# Patient Record
Sex: Male | Born: 1955 | Race: White | Hispanic: No | Marital: Single | Smoking: Current every day smoker
Health system: Southern US, Community
[De-identification: ages and names within clinical notes are randomized; demographics above are authoritative.]

## PROBLEM LIST (undated history)

## (undated) DIAGNOSIS — G20A1 Parkinson's disease without dyskinesia, without mention of fluctuations: Secondary | ICD-10-CM

## (undated) DIAGNOSIS — G20 Parkinson's disease: Secondary | ICD-10-CM

## (undated) HISTORY — PX: ABDOMINAL SURGERY: SHX537

---

## 1999-04-29 ENCOUNTER — Ambulatory Visit (HOSPITAL_COMMUNITY): Admission: RE | Admit: 1999-04-29 | Discharge: 1999-04-29 | Payer: Self-pay | Admitting: Gastroenterology

## 1999-07-04 ENCOUNTER — Encounter: Admission: RE | Admit: 1999-07-04 | Discharge: 1999-07-04 | Payer: Self-pay | Admitting: Internal Medicine

## 2008-10-19 ENCOUNTER — Emergency Department (HOSPITAL_COMMUNITY): Admission: EM | Admit: 2008-10-19 | Discharge: 2008-10-19 | Payer: Self-pay | Admitting: Emergency Medicine

## 2008-10-19 ENCOUNTER — Inpatient Hospital Stay (HOSPITAL_COMMUNITY): Admission: EM | Admit: 2008-10-19 | Discharge: 2008-10-24 | Payer: Self-pay | Admitting: Emergency Medicine

## 2008-11-03 ENCOUNTER — Encounter: Payer: Self-pay | Admitting: Internal Medicine

## 2011-01-03 LAB — LIPASE, BLOOD: Lipase: 67 U/L — ABNORMAL HIGH (ref 11–59)

## 2011-01-03 LAB — CBC
HCT: 35.1 % — ABNORMAL LOW (ref 39.0–52.0)
HCT: 44.6 % (ref 39.0–52.0)
Hemoglobin: 12.4 g/dL — ABNORMAL LOW (ref 13.0–17.0)
Hemoglobin: 13.1 g/dL (ref 13.0–17.0)
MCHC: 35.4 g/dL (ref 30.0–36.0)
MCV: 94.8 fL (ref 78.0–100.0)
Platelets: 188 10*3/uL (ref 150–400)
RBC: 3.7 MIL/uL — ABNORMAL LOW (ref 4.22–5.81)
RBC: 4 MIL/uL — ABNORMAL LOW (ref 4.22–5.81)
RDW: 12.9 % (ref 11.5–15.5)
RDW: 13.1 % (ref 11.5–15.5)
WBC: 11.6 10*3/uL — ABNORMAL HIGH (ref 4.0–10.5)
WBC: 11.7 10*3/uL — ABNORMAL HIGH (ref 4.0–10.5)

## 2011-01-03 LAB — DIFFERENTIAL
Basophils Absolute: 0 10*3/uL (ref 0.0–0.1)
Eosinophils Absolute: 0 10*3/uL (ref 0.0–0.7)
Eosinophils Relative: 0 % (ref 0–5)
Lymphocytes Relative: 10 % — ABNORMAL LOW (ref 12–46)
Lymphs Abs: 0.9 10*3/uL (ref 0.7–4.0)
Monocytes Absolute: 1 10*3/uL (ref 0.1–1.0)
Monocytes Relative: 9 % (ref 3–12)
Neutro Abs: 9.4 10*3/uL — ABNORMAL HIGH (ref 1.7–7.7)

## 2011-01-03 LAB — POCT CARDIAC MARKERS
CKMB, poc: 2.8 ng/mL (ref 1.0–8.0)
Troponin i, poc: <0.05 ng/mL (ref 0.00–0.09)

## 2011-01-03 LAB — BASIC METABOLIC PANEL
Calcium: 8 mg/dL — ABNORMAL LOW (ref 8.4–10.5)
GFR calc Af Amer: >60 mL/min (ref >60)
GFR calc non Af Amer: >60 mL/min (ref >60)
Sodium: 136 mEq/L (ref 135–145)

## 2011-01-03 LAB — POCT I-STAT, CHEM 8
BUN: 11 mg/dL (ref 6–23)
Chloride: 102 mEq/L (ref 96–112)
Creatinine, Ser: 0.9 mg/dL (ref 0.4–1.5)
Glucose, Bld: 93 mg/dL (ref 70–99)
Hemoglobin: 16 g/dL (ref 13.0–17.0)
Potassium: 4 mEq/L (ref 3.5–5.1)

## 2011-01-03 LAB — URINALYSIS, ROUTINE W REFLEX MICROSCOPIC
Bilirubin Urine: NEGATIVE
Hgb urine dipstick: NEGATIVE
Specific Gravity, Urine: 1.02 (ref 1.005–1.030)
Urobilinogen, UA: 0.2 mg/dL (ref 0.0–1.0)

## 2011-01-03 LAB — PROTIME-INR: Prothrombin Time: 13.1 seconds (ref 11.6–15.2)

## 2011-01-31 NOTE — Op Note (Signed)
[name], [name]             ACCOUNT NO.:  [id number]   MEDICAL RECORD NO.:  [id number]          PATIENT TYPE:  INP   LOCATION:  5128                         FACILITY:  [facility]   PHYSICIAN:  Sandria Bales. Ezzard Standing, M.D.  DATE OF BIRTH:  03/19/1956   DATE OF PROCEDURE:  10/19/2008  DATE OF DISCHARGE:                               OPERATIVE REPORT   Date of surgery ??   PREOPERATIVE DIAGNOSIS:  Pneumoperitoneum.   POSTOPERATIVE DIAGNOSIS:  Perforated pyloric channel ulcer.   PROCEDURE:  Oversew ulcer with Philip Olson omental patch.   SURGEON:  Sandria Bales. Ezzard Standing, MD   FIRST ASSISTANT:  Adolph Pollack, MD   ANESTHESIA:  General endotracheal.   ESTIMATED BLOOD LOSS:  50 mL.   DRAINS:  None.   INDICATIONS FOR PROCEDURE:  Philip Olson is a 55 year old white male who  has remotely seen Dr. Lona Olson as a medical doctor, is seen more  closely by Dr. Jerold Olson of the Neurology Department at [facility] for  Parkinson disease.  He presents with acute onset of abdominal pain with  a CT scan showing pneumoperitoneum.  He now comes for a laparotomy.   The indications and potential complications of surgery were explained to  the patient.  The patient was most likely felt to have a perforated  ulcer.  Risks include in bleeding, infection which I think he already  has, the need for bowel resection or ostomy, and nerve injury.   OPERATIVE NOTE:  The patient was placed in a supine position.  His  abdomen was shaved.  He had a Foley catheter in place and NG tube in  place.  He was already given cefoxitin as antibiotic.  His abdomen was  shaved, prepped with CHG and then sterilely draped.   A time-out was held identifying the patient and procedure.   An upper midline incision was made with sharp dissection and carried  down to the abdominal cavity.  Abdominal exploration revealed right and  left lobes of the liver unremarkable.  Anterior wall of stomach was  normal down to the prepyloric area  where there was purulence and  contamination noted and evidence of a perforated ulcer.  On examining,  the patient had about a 4-5 mm hole in the anterior wall of the  prepyloric area.  There was no evidence of any tumor or other mass.  I  put three 2-0 silk sutures to close the hole, then I brought some  omentum and laid over this hole and tacked this down with 2-0 silk  sutures.   I then irrigated the abdomen with 5 L of saline.  He really had a modest  contamination.  The abdomen was then closed with 2 running double-  stranded #1 PDS sutures tied in the middle.  The sponge and needle count  were correct at the end of the case.  The patient was transferred to the  recovery room in good condition.      Sandria Bales. Ezzard Standing, M.D.  Electronically Signed     DHN/MEDQ  D:  10/19/2008  T:  10/20/2008  Job:  [id number]  cc:   Titus Dubin. Alwyn Ren, MD,FACP,FCCP  Philip Olson, M.D.

## 2011-01-31 NOTE — H&P (Signed)
Philip Olson, Philip Olson             ACCOUNT NO.:  [id number]   MEDICAL RECORD NO.:  [id number]          PATIENT TYPE:  INP   LOCATION:  5128                         FACILITY:  [facility]   PHYSICIAN:  Sandria Bales. Ezzard Standing, M.D.  DATE OF BIRTH:  Jan 20, 1956   DATE OF ADMISSION:  10/19/2008  DATE OF DISCHARGE:                              HISTORY & PHYSICAL   Date of admission ?   REASON FOR ADMISSION:  Abdominal pain, pnuemoperitoneum.   HISTORY OF PRESENT ILLNESS:  Mr. Philip Olson is a 55 year old white male  who has no primary MD in [place], but sees Dr. Jerold Coombe in the  Neurology Department at [facility] for Parkinson disease of about 10 years'  duration.  He has remotely (> 10 years) seen Dr. Nelva Nay. Hopper.   He was having some vague back pain last week that he attributed to work  and lifting.  He took Aleve for about 3 or 4 days, but because of some  stomach upset, switched to ibuprofen over the weekend.   This morning about 9 a.m., he had acute onset of abdominal pain while at  work.  The pain radiated to his right shoulder.  He was taken by  ambulance to [facility], but according to the patient and his  significant other, Cleda Mccreedy, he had no attention for about 20-30 minutes so  he got up, left [name] and came to [facility].   He denies any history of known peptic ulcer disease, liver disease or  colon disease.  He has never had an upper endoscopy or colonoscopy.  He  had an elective laparoscopic cholecystectomy over 10 years ago about  2000 by Dr. Algis Downs. Newman.  He has had no colon disease or colonoscopy and  no prior abdominal surgery other than laparoscopic cholecystectomy.   He has no allergies.   His current medications include Requip and amantadine.  He takes both  these for his Parkinson disease.   REVIEW OF SYSTEMS:  NEUROLOGIC:  Again, he has a diagnosis of Parkinson  for some 10+ years and sees rarely people at [facility].  He has seen Dr.  Marga Melnick in the  past, but he thinks it has been over 5 years, if  not longer, since he has last seen him.  He does not go to his physician  regularly.  PULMONARY:  He smokes a pipe.  He takes about 2 bowls a day on his pipe.  CARDIAC:  About 10+ years, he did have some vague chest pain, where he  went to a treadmill and Cardiolite, which was apparently a negative  evaluation.  He has not seen a cardiologist in those intervening 10  years nor has he had anymore has chest pain, angina, or cardiac  complaints.  GASTROINTESTINAL:  See history of present illness.  UROLOGIC:  No kidney stones or kidney infections.   He works at [employer].   PHYSICAL EXAMINATION:  VITAL SIGNS:  Temperature 100.6, blood pressure  119/62, pulse is 92, respirations 68, sating at 96%.  GENERAL:  He is a well-nourished, average build, middle-aged male, alert  and  cooperative on physical exam.  HEENT:  Unremarkable.  NECK:  Supple, found no mass or thyromegaly.  LYMPH NODES:  He has no supraclavicular or axillary adenopathy.  LUNGS:  Clear to auscultation though he does have trouble taking a deep  breath.  HEART:  Regular rate and rhythm.  I hear no murmur or rub.  ABDOMEN:  He actually has active bowel sounds, but has epigastric  tenderness and guarding which is sort of central and not lateralized.  He has no abdominal mass.  No hernia.  EXTREMITIES:  He had good strength in the upper and lower extremities.  NEUROLOGIC:  Grossly intact.   His labs show a white blood count of 11,600, hemoglobin 15, hematocrit  44, platelet count of 188,000, he has 87% neutrophils.   His sodium is 139, potassium 4.0, chloride 102, BUN of 11, creatinine of  0.9.  His prothrombin time, PT is 13.1, INR is 1.0.  His urinalysis was  negative.  His lipase was 67.   His CT scan which I reviewed with Radiology shows absent gallbladder,  but this is what looks like tiny dots of free air/pneumoperitoneum under  the edge of his liver and around his  duodenum with some free fluid over  the right lobe of the liver.   IMPRESSION:  1. Pneumoperitoneum with signs and symptoms consistent with a probable      perforated stomach/duodenum possibly due to ulcer disease.  I spoke      to the patient and his girlfriend about the options for treatment.      I really think it needs to be explored to evaluate where the      perforation is and try to repair this.  He understands that there      is a significant risk for smoking a pipe and the need to quit this      going forward, and he may need to see somebody from a GI or medical      standpoint locally.  I discussed with him the risks of surgery,      which include, but are not limited to, bleeding, infection, and      possibility of bowel resection and possibility that the perforation      may be something other than stomach/duodenum, and the possibility      that if he has a perforation, it could be a continued problem to      manage.  2. History of Parkinson disease, which is stable for some 10 years.  3. He smokes pipe.  He knows it is bad for his health.      Sandria Bales. Ezzard Standing, M.D.  Electronically Signed     DHN/MEDQ  D:  10/19/2008  T:  10/20/2008  Job:  [id number]

## 2011-02-03 NOTE — Discharge Summary (Signed)
Philip Olson, Philip Olson             ACCOUNT NO.:  [id number]   MEDICAL RECORD NO.:  [id number]          PATIENT TYPE:  INP   LOCATION:  5128                         FACILITY:  [facility]   PHYSICIAN:  Thornton Park. Daphine Deutscher, MD  DATE OF BIRTH:  01-Jan-1956   DATE OF ADMISSION:  10/19/2008  DATE OF DISCHARGE:  10/24/2008                               DISCHARGE SUMMARY   ADMITTING PHYSICIAN:  Sandria Bales. Ezzard Standing, MD   DISCHARGING PHYSICIAN:  Thornton Park. Daphine Deutscher, MD   OPERATING PHYSICIAN:  Sandria Bales. Ezzard Standing, MD   CHIEF COMPLAINT/REASON FOR ADMISSION:  Mr. Huntsman is a 55 year old  male patient in no apparent regular medical problems with waking on the  morning of admission with severe abdominal pain.  He presented to the  [facility] ER, but had to wait an extended period.  The patient left  [name] and presented to [facility] ER.  On presentation, his  temperature was 100.6, blood pressure 119/62, and pulse 92.  On  abdominal exam, he had epigastric tenderness and guarding centrally.  No  palpable masses.  CT was done and that showed tiny dots of free air and  pneumoperitoneum under the edge of his liver and around duodenum with  free fluid of the right lobe of the liver.  The patient was subsequently  evaluated by Dr. Ezzard Standing, where he was found to have a diagnosis of  pneumoperitoneum, probably related to perforation of gastric or duodenal  ulcer.   ADMITTING DIAGNOSES:  1. Pneumoperitoneum, perforated viscus.  2. History of Parkinson disease.  3. Ongoing tobacco use.   HOSPITAL COURSE:  The patient was admitted, taken directly to the OR by  Dr. Ezzard Standing, where he was found to have a perforated pyloric channel  ulcer.  He underwent an oversewing of ulcer with a Graham omental patch  and irrigation with 5 L of saline.  The patient was sent back to the  general floor to recover.   The patient had an uneventful postoperative course.  Chest x-ray did  show bilateral multifocal consolidation,  possible pneumonia versus  aspiration.  An NG tube was left in place in immediate postoperative  period.  Incision remained clean, dry and intact.  He had been placed on  empiric proton pump inhibitors and cefoxitin as well.  By postop day #2,  his NG tube was able to be discontinued.  His diet was slowly advanced  and by postoperative day #4, he was ready for discharge.  His incision  was clean, dry and intact.  Staples were in place.  He was tolerating a  solid diet.   FINAL DISCHARGE DIAGNOSES:  1. Abdominal pain, secondary to perforated pyloric channel ulcer.  2. Status post exploratory laparotomy with oversew of a Graham patch      of pyloric channel ulcer.   DISCHARGE MEDICATIONS:  Vicodin as directed for pain.   DIET:  No restrictions.   WOUND CARE:  Pat staple line dry.   FOLLOWUP:  Return to Dr. Allene Pyo clinic for staple removal in next  week.  Please call for an appointment.   ACTIVITY:  Increase  activity slowly.  No lifting greater than 10 pounds  for 6 weeks.  No driving for 1 week.       Allison L. Gwyneth Sprout Daphine Deutscher, MD  Electronically Signed    ALE/MEDQ  D:  12/07/2008  T:  12/08/2008  Job:  [id number]   cc:   Sandria Bales. Ezzard Standing, M.D.

## 2013-06-09 ENCOUNTER — Other Ambulatory Visit: Payer: Self-pay | Admitting: Family Medicine

## 2013-06-09 ENCOUNTER — Ambulatory Visit (INDEPENDENT_AMBULATORY_CARE_PROVIDER_SITE_OTHER): Payer: BC Managed Care – PPO | Admitting: Family Medicine

## 2013-06-09 VITALS — BP 108/66 | HR 80 | Temp 97.9°F | Resp 20 | Ht 71.0 in | Wt 179.8 lb

## 2013-06-09 DIAGNOSIS — R634 Abnormal weight loss: Secondary | ICD-10-CM

## 2013-06-09 DIAGNOSIS — J42 Unspecified chronic bronchitis: Secondary | ICD-10-CM

## 2013-06-09 DIAGNOSIS — J209 Acute bronchitis, unspecified: Secondary | ICD-10-CM

## 2013-06-09 DIAGNOSIS — J019 Acute sinusitis, unspecified: Secondary | ICD-10-CM

## 2013-06-09 LAB — COMPREHENSIVE METABOLIC PANEL
ALT: 17 U/L (ref 0–53)
AST: 22 U/L (ref 0–37)
Albumin: 4.4 g/dL (ref 3.5–5.2)
Alkaline Phosphatase: 72 U/L (ref 39–117)
Glucose, Bld: 71 mg/dL (ref 70–99)
Potassium: 4.1 mEq/L (ref 3.5–5.3)
Sodium: 136 mEq/L (ref 135–145)
Total Bilirubin: 0.4 mg/dL (ref 0.3–1.2)
Total Protein: 7.6 g/dL (ref 6.0–8.3)

## 2013-06-09 LAB — POCT CBC
Granulocyte percent: 73.2 %G (ref 37–80)
HCT, POC: 46.9 % (ref 43.5–53.7)
MPV: 8.8 fL (ref 0–99.8)
POC Granulocyte: 5.4 (ref 2–6.9)
POC LYMPH PERCENT: 20.9 %L (ref 10–50)
POC MID %: 5.9 %M (ref 0–12)
RDW, POC: 13.5 %

## 2013-06-09 MED ORDER — CEFDINIR 300 MG PO CAPS: 300.0000 mg | ORAL_CAPSULE | Freq: Two times a day (BID) | ORAL | Status: DC

## 2013-06-09 NOTE — Patient Instructions (Addendum)
Use the omnicef as directed.  Get plenty of rest, and push fluids.  If you are not getting better in the next few days please let me know- Sooner if worse.

## 2013-06-09 NOTE — Progress Notes (Signed)
[facility] [address], [place] [address] [phone]  Date:  06/09/2013   Name:  Philip Olson   DOB:  November 19, 1955   MRN:  [id number]  PCP:  No primary provider on file.    Chief Complaint: Cough, Otitis Media and Headache   History of Present Illness:  Philip Olson is a 57 y.o. very pleasant male patient who presents with the following:  About a week ago he noted onset of cough.  He has progressed into worsening chest congestion, ear congestion, headache and nausea.  Over the last few days his temp has been up to 99, and he has been using buffered aspirin prn.  He last took aspirin this morning.    He is coughing up some material over the last few days.  He has noted a sore throat, ? Due to cough.  His ears feel "completly flooded."   No vomiting    He is generally healthy.  He does not see MD often.  "I never get sick."  He did smoke up until about 18 months ago- 1/2 ppd.   His BP tends to run about "93/62" so today's reading is not low for him.    He has dropped about 5lbs since he became sick There are no active problems to display for this patient.   History reviewed. No pertinent past medical history.  History reviewed. No pertinent past surgical history.  History  Substance Use Topics  . Smoking status: Never Smoker   . Smokeless tobacco: Not on file  . Alcohol Use: No    Family History  Problem Relation Age of Onset  . Heart disease Father     No Known Allergies  Medication list has been reviewed and updated.  No current outpatient prescriptions on file prior to visit.   No current facility-administered medications on file prior to visit.    Review of Systems:  As per HPI- otherwise negative.   Physical Examination: Filed Vitals:   06/09/13 0859  BP: 108/66  Pulse: 80  Temp: 97.9 F (36.6 C)  Resp: 20   Filed Vitals:   06/09/13 0859  Height: 5\' 11"  (1.803 m)  Weight: 179 lb 12.8 oz (81.557 kg)    Body mass index is 25.09 kg/(m^2). Ideal Body Weight: Weight in (lb) to have BMI = 25: 178.9  GEN: WDWN, NAD, Non-toxic, A & O x 3.  Appears congested HEENT: Atraumatic, Normocephalic. Neck supple. No masses, No LAD.  Bilateral TM wnl, oropharynx normal.  PEERL,EOMI.  Nasal cavity is congested Ears and Nose: No external deformity. CV: RRR, No M/G/R. No JVD. No thrill. No extra heart sounds. PULM: CTA B, no wheezes, crackles, rhonchi. No retractions. No resp. distress. No accessory muscle use. ABD: S, NT, ND. No rebound. No HSM. EXTR: No c/c/e NEURO Normal gait.  PSYCH: Normally interactive. Conversant. Not depressed or anxious appearing.  Calm demeanor.   Results for orders placed in visit on 06/09/13  POCT CBC      Result Value Range   WBC 7.4  4.6 - 10.2 K/uL   Lymph, poc 1.5  0.6 - 3.4   POC LYMPH PERCENT 20.9  10 - 50 %L   MID (cbc) 0.4  0 - 0.9   POC MID % 5.9  0 - 12 %M   POC Granulocyte 5.4  2 - 6.9   Granulocyte percent 73.2  37 - 80 %G   RBC 4.69  4.69 - 6.13 M/uL  Hemoglobin 15.1  14.1 - 18.1 g/dL   HCT, POC 40.9  81.1 - 53.7 %   MCV 100.1 (*) 80 - 97 fL   MCH, POC 32.2 (*) 27 - 31.2 pg   MCHC 32.2  31.8 - 35.4 g/dL   RDW, POC 91.4     Platelet Count, POC 193  142 - 424 K/uL   MPV 8.8  0 - 99.8 fL    Assessment and Plan: Sinusitis, acute - Plan: cefdinir (OMNICEF) 300 MG capsule  Acute bronchitis - Plan: cefdinir (OMNICEF) 300 MG capsule  Loss of weight - Plan: POCT CBC, Comprehensive metabolic panel, TSH  Treat as above for sinusitis and bronchitis.  He has lost a few lbs- likely due to acute illness. Will do basic labs and follow-up.  If he does not regain once well will look further.  See patient instructions for more details.      Signed Abbe Amsterdam, MD

## 2013-06-10 ENCOUNTER — Encounter: Payer: Self-pay | Admitting: Family Medicine

## 2013-06-12 LAB — VITAMIN B12: Vitamin B-12: 550 pg/mL (ref 211–911)

## 2013-06-19 ENCOUNTER — Encounter: Payer: Self-pay | Admitting: Family Medicine

## 2013-07-09 ENCOUNTER — Other Ambulatory Visit: Payer: Self-pay | Admitting: Internal Medicine

## 2013-07-09 DIAGNOSIS — R634 Abnormal weight loss: Secondary | ICD-10-CM

## 2013-07-11 ENCOUNTER — Ambulatory Visit
Admission: RE | Admit: 2013-07-11 | Discharge: 2013-07-11 | Disposition: A | Discharge status: Home / Self Care | Payer: BC Managed Care – PPO | Source: Ambulatory Visit | Attending: Internal Medicine | Admitting: Internal Medicine

## 2013-07-11 DIAGNOSIS — R634 Abnormal weight loss: Secondary | ICD-10-CM

## 2014-02-12 ENCOUNTER — Ambulatory Visit (INDEPENDENT_AMBULATORY_CARE_PROVIDER_SITE_OTHER): Payer: BC Managed Care – PPO | Admitting: Emergency Medicine

## 2014-02-12 ENCOUNTER — Ambulatory Visit: Payer: BC Managed Care – PPO

## 2014-02-12 VITALS — BP 120/68 | HR 77 | Temp 98.0°F | Resp 20

## 2014-02-12 DIAGNOSIS — S99919A Unspecified injury of unspecified ankle, initial encounter: Secondary | ICD-10-CM

## 2014-02-12 DIAGNOSIS — S8990XA Unspecified injury of unspecified lower leg, initial encounter: Secondary | ICD-10-CM

## 2014-02-12 DIAGNOSIS — S99929A Unspecified injury of unspecified foot, initial encounter: Secondary | ICD-10-CM

## 2014-02-12 DIAGNOSIS — S93409A Sprain of unspecified ligament of unspecified ankle, initial encounter: Secondary | ICD-10-CM

## 2014-02-12 DIAGNOSIS — S99911A Unspecified injury of right ankle, initial encounter: Secondary | ICD-10-CM

## 2014-02-12 MED ORDER — ACETAMINOPHEN-CODEINE #3 300-30 MG PO TABS: 1.0000 | ORAL_TABLET | ORAL | Status: DC | PRN

## 2014-02-12 NOTE — Patient Instructions (Signed)

## 2014-02-12 NOTE — Progress Notes (Signed)
[facility] [address] [place] [address] [phone]  Date:  02/12/2014   Name:  Philip Olson   DOB:  10/28/1955   MRN:  [id number]  PCP:  No PCP Per Patient    Chief Complaint: Ankle Injury   History of Present Illness:  Philip Olson is a 58 y.o. very pleasant male patient who presents with the following:  Injured today when fell out of bed, injuring his right ankle.  Unable to bear weight due to pain.  Moderate swelling in lateral ankle.  No improvement with over the counter medications or other home remedies. Denies other complaint or health concern today.   There are no active problems to display for this patient.   No past medical history on file.  No past surgical history on file.  History  Substance Use Topics  . Smoking status: Never Smoker   . Smokeless tobacco: Not on file  . Alcohol Use: No    Family History  Problem Relation Age of Onset  . Heart disease Father     No Known Allergies  Medication list has been reviewed and updated.  No current outpatient prescriptions on file prior to visit.   No current facility-administered medications on file prior to visit.    Review of Systems:  As per HPI, otherwise negative.    Physical Examination: Filed Vitals:   02/12/14 2011  BP: 120/68  Pulse: 77  Temp: 98 F (36.7 C)  Resp: 20   There were no vitals filed for this visit. There is no weight on file to calculate BMI. Ideal Body Weight:     GEN: WDWN, NAD, Non-toxic, Alert & Oriented x 3 HEENT: Atraumatic, Normocephalic.  Ears and Nose: No external deformity. EXTR: No clubbing/cyanosis/edema NEURO: wheelchair bound.  PSYCH: Normally interactive. Conversant. Not depressed or anxious appearing.  Calm demeanor.  RIGHT ankle:  Moderate swelling lateral ankle. No deformity or ecchymosis  Assessment and Plan: Sprain ankle RICE Boot Crutches tyl #3  Signed,  Phillips Odor, MD   [facility] reading  (PRIMARY) by  Dr. Dareen Piano.  Negative xray.

## 2014-02-18 ENCOUNTER — Ambulatory Visit: Payer: BC Managed Care – PPO

## 2014-02-18 ENCOUNTER — Ambulatory Visit (INDEPENDENT_AMBULATORY_CARE_PROVIDER_SITE_OTHER): Payer: BC Managed Care – PPO | Admitting: Family Medicine

## 2014-02-18 VITALS — BP 116/68 | HR 71 | Temp 98.8°F | Resp 16 | Ht 73.0 in | Wt 192.2 lb

## 2014-02-18 DIAGNOSIS — S93409A Sprain of unspecified ligament of unspecified ankle, initial encounter: Secondary | ICD-10-CM

## 2014-02-18 DIAGNOSIS — M79609 Pain in unspecified limb: Secondary | ICD-10-CM

## 2014-02-18 DIAGNOSIS — M25571 Pain in right ankle and joints of right foot: Secondary | ICD-10-CM

## 2014-02-18 DIAGNOSIS — M79671 Pain in right foot: Secondary | ICD-10-CM

## 2014-02-18 DIAGNOSIS — M25579 Pain in unspecified ankle and joints of unspecified foot: Secondary | ICD-10-CM

## 2014-02-18 LAB — POCT CBC
Granulocyte percent: 77.4 %G (ref 37–80)
HEMATOCRIT: 46.3 % (ref 43.5–53.7)
HEMOGLOBIN: 14.7 g/dL (ref 14.1–18.1)
Lymph, poc: 1.5 (ref 0.6–3.4)
MCH: 31.6 pg — AB (ref 27–31.2)
MCHC: 31.7 g/dL — AB (ref 31.8–35.4)
MCV: 99.5 fL — AB (ref 80–97)
MID (cbc): 0.5 (ref 0–0.9)
MPV: 7.9 fL (ref 0–99.8)
POC Granulocyte: 6.9 (ref 2–6.9)
POC LYMPH PERCENT: 17.3 %L (ref 10–50)
POC MID %: 5.3 %M (ref 0–12)
Platelet Count, POC: 189 10*3/uL (ref 142–424)
RBC: 4.65 M/uL — AB (ref 4.69–6.13)
RDW, POC: 13.2 %
WBC: 8.9 10*3/uL (ref 4.6–10.2)

## 2014-02-18 MED ORDER — HYDROCODONE-ACETAMINOPHEN 5-325 MG PO TABS: 1.0000 | ORAL_TABLET | Freq: Four times a day (QID) | ORAL | Status: DC | PRN

## 2014-02-18 MED ORDER — INDOMETHACIN 50 MG PO CAPS: 50.0000 mg | ORAL_CAPSULE | Freq: Three times a day (TID) | ORAL | Status: DC

## 2014-02-18 NOTE — Progress Notes (Signed)
Subjective:    Patient ID: Philip Olson, male    DOB: [birth year], 58 y.o.   MRN: [id number]  HPI   Philip Olson is a very pleasant 58 yr old male here for follow up on a RIGHT ankle sprain.  Was initially seen here 5/28.  That day he had gotten out of bed and his leg was asleep which caused him to fall and injure the ankle.  He was not able to bear weight.  He reports the ankle was extremely swollen.  The swelling has improved somewhat but he continues to have significant pain.  No improvement since initial visit.  Pain has not responded to Tylenol #3.  He has not taken NSAIDs.  Xrays at initial visit were negative for fracture.  He has been wearing a CAM walker and ambulating with crutches.  Icing and elevating the leg frequently.  Has been out of work since time of injury  Thursday night saw Dr. Dareen PianoAnderson No fx   Review of Systems  Constitutional: Negative for fever and chills.  Respiratory: Negative.   Cardiovascular: Negative.   Musculoskeletal: Positive for arthralgias and gait problem.  Skin: Positive for color change.  Neurological: Negative for numbness.       Objective:   Physical Exam  Vitals reviewed. Constitutional: He is oriented to person, place, and time. He appears well-developed and well-nourished. No distress.  HENT:  Head: Normocephalic and atraumatic.  Eyes: Conjunctivae are normal. No scleral icterus.  Pulmonary/Chest: Effort normal.  Musculoskeletal:       Right ankle: He exhibits decreased range of motion, swelling and ecchymosis. He exhibits no deformity and normal pulse. Tenderness. Lateral malleolus, medial malleolus, AITFL, CF ligament and proximal fibula tenderness found. No head of 5th metatarsal tenderness found. Achilles tendon exhibits pain. Achilles tendon exhibits normal Thompson's test results.       Feet:  RIGHT ankle significantly swollen compared with left; ecchymosis at medial and lateral aspects of heel; tenderness at medial malleolus and  achilles; significant tenderness over the lateral malleolus; additionally there is an area of erythema and warmth over the lateral aspect of the ankle; he is exquisitely tender in this location, even to light touch; ROM is extremely limited due to pain; no change in sensation  Neurological: He is alert and oriented to person, place, and time.  Skin: Skin is warm and dry.  Psychiatric: He has a normal mood and affect. His behavior is normal.     [facility] reading (PRIMARY) by  Dr. Alwyn RenHopper - foot and ankle negative for fracture   Results for orders placed in visit on 02/18/14  POCT CBC      Result Value Ref Range   WBC 8.9  4.6 - 10.2 K/uL   Lymph, poc 1.5  0.6 - 3.4   POC LYMPH PERCENT 17.3  10 - 50 %L   MID (cbc) 0.5  0 - 0.9   POC MID % 5.3  0 - 12 %M   POC Granulocyte 6.9  2 - 6.9   Granulocyte percent 77.4  37 - 80 %G   RBC 4.65 (*) 4.69 - 6.13 M/uL   Hemoglobin 14.7  14.1 - 18.1 g/dL   HCT, POC 33.246.3  95.143.5 - 53.7 %   MCV 99.5 (*) 80 - 97 fL   MCH, POC 31.6 (*) 27 - 31.2 pg   MCHC 31.7 (*) 31.8 - 35.4 g/dL   RDW, POC 88.413.2     Platelet Count, POC 189  142 - 424  K/uL   MPV 7.9  0 - 99.8 fL       Assessment & Plan:  Ankle sprain - Plan: indomethacin (INDOCIN) 50 MG capsule, Ambulatory referral to Orthopedic Surgery  Ankle pain, right - Plan: POCT CBC, DG Ankle Complete Right, DG Foot Complete Right, Uric Acid, indomethacin (INDOCIN) 50 MG capsule, HYDROcodone-acetaminophen (NORCO) 5-325 MG per tablet, Ambulatory referral to Orthopedic Surgery  Foot pain, right - Plan: POCT CBC, DG Ankle Complete Right, DG Foot Complete Right, Uric Acid, indomethacin (INDOCIN) 50 MG capsule, HYDROcodone-acetaminophen (NORCO) 5-325 MG per tablet, Ambulatory referral to Orthopedic Surgery   Philip Olson is a very pleasant 58 yr old male here for follow up on a RIGHT ankle sprain that occurred 6 days ago.  Some improvement in swelling but no improvement in pain.  Unable to bear weight.  xrays are again  negative.  He has an area of erythema and warmth over the lateral malleolus - no leukocytosis, afebrile, doubt infection, but ?gout precipitated by injury.  He has no history of gout.  Will check uric acid.  In any case, start indomethacin TID with food.  Norco if needed for breakthrough pain.  Continue icing, elevating.  Continue CAM and crutches.  Will refer to ortho - pt prefers to see Dr. Cleophas Dunker at [facility].  OOW until ortho eval.  RTC if worsening or concerns prior to seeing ortho  Pt to call or RTC if worsening or not improving  Case discussed and pt examined with Dr. Marquis Lunch MHS, PA-C [facility] 6/3/20154:57 PM

## 2014-02-18 NOTE — Progress Notes (Signed)
X-rays were examined and no fracture noted. Patient was exam and also along with the physician assistant and plan discussed and agreed upon.

## 2014-02-18 NOTE — Patient Instructions (Signed)
Take the indomethacin (Indocin) every 8 hours with food - this will work on pain and inflammation  Take the Norco if needed for breakthrough pain  Continue wearing the boot and using crutches to walk  Continue icing and elevating  I am placing a referral to orthopedics, so you will get a phone call about this appointment  I am also sending out a lab to test your uric acid which may give [place] some indication about whether or not this could be a gout attack precipitated by the injury  If you have any concerns prior to seeing ortho, please let us know

## 2014-02-19 LAB — URIC ACID: URIC ACID, SERUM: 4.1 mg/dL (ref 4.0–7.8)

## 2015-06-26 ENCOUNTER — Inpatient Hospital Stay (HOSPITAL_COMMUNITY): Payer: BLUE CROSS/BLUE SHIELD

## 2015-06-26 ENCOUNTER — Emergency Department (HOSPITAL_COMMUNITY): Payer: BLUE CROSS/BLUE SHIELD

## 2015-06-26 ENCOUNTER — Inpatient Hospital Stay (HOSPITAL_COMMUNITY)
Admission: EM | Admit: 2015-06-26 | Discharge: 2015-06-30 | DRG: 814 | Disposition: A | Discharge status: Home / Self Care | Payer: BLUE CROSS/BLUE SHIELD | Attending: General Surgery | Admitting: General Surgery

## 2015-06-26 ENCOUNTER — Encounter (HOSPITAL_COMMUNITY): Payer: Self-pay | Admitting: Emergency Medicine

## 2015-06-26 DIAGNOSIS — I959 Hypotension, unspecified: Secondary | ICD-10-CM | POA: Diagnosis present

## 2015-06-26 DIAGNOSIS — F17200 Nicotine dependence, unspecified, uncomplicated: Secondary | ICD-10-CM | POA: Diagnosis present

## 2015-06-26 DIAGNOSIS — S36031A Moderate laceration of spleen, initial encounter: Principal | ICD-10-CM | POA: Diagnosis present

## 2015-06-26 DIAGNOSIS — K661 Hemoperitoneum: Secondary | ICD-10-CM | POA: Diagnosis present

## 2015-06-26 DIAGNOSIS — G20 Parkinson's disease: Secondary | ICD-10-CM | POA: Diagnosis present

## 2015-06-26 DIAGNOSIS — Z79899 Other long term (current) drug therapy: Secondary | ICD-10-CM | POA: Diagnosis not present

## 2015-06-26 DIAGNOSIS — D62 Acute posthemorrhagic anemia: Secondary | ICD-10-CM | POA: Diagnosis present

## 2015-06-26 DIAGNOSIS — S36039A Unspecified laceration of spleen, initial encounter: Secondary | ICD-10-CM | POA: Diagnosis present

## 2015-06-26 HISTORY — DX: Parkinson's disease: G20

## 2015-06-26 HISTORY — DX: Parkinson's disease without dyskinesia, without mention of fluctuations: G20.A1

## 2015-06-26 LAB — CBC
HEMATOCRIT: 35.7 % — AB (ref 39.0–52.0)
Hemoglobin: 11.8 g/dL — ABNORMAL LOW (ref 13.0–17.0)
MCH: 30.6 pg (ref 26.0–34.0)
MCHC: 33.1 g/dL (ref 30.0–36.0)
MCV: 92.7 fL (ref 78.0–100.0)
Platelets: 118 10*3/uL — ABNORMAL LOW (ref 150–400)
RBC: 3.85 MIL/uL — AB (ref 4.22–5.81)
RDW: 14.6 % (ref 11.5–15.5)
WBC: 10.3 10*3/uL (ref 4.0–10.5)

## 2015-06-26 LAB — CBC WITH DIFFERENTIAL/PLATELET
BASOS ABS: 0 10*3/uL (ref 0.0–0.1)
Basophils Relative: 0 %
EOS ABS: 0.2 10*3/uL (ref 0.0–0.7)
EOS PCT: 1 %
HCT: 38.2 % — ABNORMAL LOW (ref 39.0–52.0)
Hemoglobin: 12.7 g/dL — ABNORMAL LOW (ref 13.0–17.0)
Lymphocytes Relative: 7 %
Lymphs Abs: 1 10*3/uL (ref 0.7–4.0)
MCH: 31.6 pg (ref 26.0–34.0)
MCHC: 33.2 g/dL (ref 30.0–36.0)
MCV: 95 fL (ref 78.0–100.0)
MONO ABS: 1.1 10*3/uL — AB (ref 0.1–1.0)
Monocytes Relative: 8 %
Neutro Abs: 11.5 10*3/uL — ABNORMAL HIGH (ref 1.7–7.7)
Neutrophils Relative %: 84 %
PLATELETS: 147 10*3/uL — AB (ref 150–400)
RBC: 4.02 MIL/uL — AB (ref 4.22–5.81)
RDW: 13 % (ref 11.5–15.5)
WBC: 13.8 10*3/uL — AB (ref 4.0–10.5)

## 2015-06-26 LAB — BASIC METABOLIC PANEL
ANION GAP: 6 (ref 5–15)
BUN: 8 mg/dL (ref 6–20)
CALCIUM: 8.2 mg/dL — AB (ref 8.9–10.3)
CO2: 28 mmol/L (ref 22–32)
Chloride: 103 mmol/L (ref 101–111)
Creatinine, Ser: 0.96 mg/dL (ref 0.61–1.24)
GFR calc Af Amer: >60 mL/min (ref >60)
GLUCOSE: 145 mg/dL — AB (ref 65–99)
POTASSIUM: 4.4 mmol/L (ref 3.5–5.1)
SODIUM: 137 mmol/L (ref 135–145)

## 2015-06-26 LAB — PREPARE RBC (CROSSMATCH)

## 2015-06-26 LAB — MRSA PCR SCREENING: MRSA by PCR: NEGATIVE

## 2015-06-26 LAB — ABO/RH: ABO/RH(D): A POS

## 2015-06-26 MED ORDER — PANTOPRAZOLE SODIUM 40 MG IV SOLR
40.0000 mg | Freq: Every day | INTRAVENOUS | Status: DC
  Filled 2015-06-26: qty 40

## 2015-06-26 MED ORDER — ACETAMINOPHEN 325 MG PO TABS
650.0000 mg | ORAL_TABLET | ORAL | Status: DC | PRN
  Administered 2015-06-29: 650 mg via ORAL
  Filled 2015-06-26: qty 2

## 2015-06-26 MED ORDER — SODIUM CHLORIDE 0.9 % IV SOLN: Freq: Once | INTRAVENOUS | Status: DC

## 2015-06-26 MED ORDER — ONDANSETRON HCL 4 MG PO TABS: 4.0000 mg | ORAL_TABLET | Freq: Four times a day (QID) | ORAL | Status: DC | PRN

## 2015-06-26 MED ORDER — SODIUM CHLORIDE 0.9 % IV SOLN
INTRAVENOUS | Status: DC
  Administered 2015-06-26 (×2): via INTRAVENOUS
  Administered 2015-06-27: 75 mL/h via INTRAVENOUS
  Administered 2015-06-28: 06:00:00 via INTRAVENOUS

## 2015-06-26 MED ORDER — PANTOPRAZOLE SODIUM 40 MG PO TBEC
40.0000 mg | DELAYED_RELEASE_TABLET | Freq: Every day | ORAL | Status: DC
Start: 2015-06-26 — End: 2015-06-29
  Administered 2015-06-27 – 2015-06-29 (×3): 40 mg via ORAL
  Filled 2015-06-26 (×3): qty 1

## 2015-06-26 MED ORDER — SODIUM CHLORIDE 0.9 % IV BOLUS (SEPSIS)
1000.0000 mL | Freq: Once | INTRAVENOUS | Status: AC
  Administered 2015-06-26: 1000 mL via INTRAVENOUS

## 2015-06-26 MED ORDER — ONDANSETRON HCL 4 MG/2ML IJ SOLN
4.0000 mg | Freq: Four times a day (QID) | INTRAMUSCULAR | Status: DC | PRN
  Administered 2015-06-27 – 2015-06-28 (×2): 4 mg via INTRAVENOUS
  Filled 2015-06-26 (×2): qty 2

## 2015-06-26 MED ORDER — FENTANYL CITRATE (PF) 100 MCG/2ML IJ SOLN
100.0000 ug | Freq: Once | INTRAMUSCULAR | Status: AC
  Administered 2015-06-26: 100 ug via INTRAVENOUS
  Filled 2015-06-26: qty 2

## 2015-06-26 MED ORDER — MORPHINE SULFATE (PF) 2 MG/ML IV SOLN
2.0000 mg | INTRAVENOUS | Status: DC | PRN
  Administered 2015-06-26 – 2015-06-27 (×2): 2 mg via INTRAVENOUS
  Filled 2015-06-26 (×2): qty 1

## 2015-06-26 MED ORDER — ACETAMINOPHEN 10 MG/ML IV SOLN
1000.0000 mg | Freq: Four times a day (QID) | INTRAVENOUS | Status: AC | PRN
Start: 2015-06-26 — End: 2015-06-27
  Administered 2015-06-26 (×2): 1000 mg via INTRAVENOUS
  Filled 2015-06-26 (×3): qty 100

## 2015-06-26 MED ORDER — IOHEXOL 300 MG/ML  SOLN
100.0000 mL | Freq: Once | INTRAMUSCULAR | Status: AC | PRN
  Administered 2015-06-26: 100 mL via INTRAVENOUS
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
## 2015-06-26 NOTE — ED Notes (Signed)
MD at the bedside  
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
## 2015-06-26 NOTE — ED Notes (Signed)
Girlfriend Lola's number: [phone], [phone]

## 2015-06-27 LAB — CBC
HCT: 31.5 % — ABNORMAL LOW (ref 39.0–52.0)
HCT: 32.9 % — ABNORMAL LOW (ref 39.0–52.0)
HEMATOCRIT: 33.4 % — AB (ref 39.0–52.0)
HEMATOCRIT: 31.1 % — AB (ref 39.0–52.0)
HEMOGLOBIN: 11.1 g/dL — AB (ref 13.0–17.0)
HEMOGLOBIN: 10.6 g/dL — AB (ref 13.0–17.0)
Hemoglobin: 10.6 g/dL — ABNORMAL LOW (ref 13.0–17.0)
Hemoglobin: 11.2 g/dL — ABNORMAL LOW (ref 13.0–17.0)
MCH: 31 pg (ref 26.0–34.0)
MCH: 30.9 pg (ref 26.0–34.0)
MCH: 31.2 pg (ref 26.0–34.0)
MCH: 31.3 pg (ref 26.0–34.0)
MCHC: 33.5 g/dL (ref 30.0–36.0)
MCHC: 33.7 g/dL (ref 30.0–36.0)
MCHC: 33.7 g/dL (ref 30.0–36.0)
MCHC: 34.1 g/dL (ref 30.0–36.0)
MCV: 91.7 fL (ref 78.0–100.0)
MCV: 91.8 fL (ref 78.0–100.0)
MCV: 92.4 fL (ref 78.0–100.0)
MCV: 92.5 fL (ref 78.0–100.0)
PLATELETS: 95 10*3/uL — AB (ref 150–400)
PLATELETS: 99 10*3/uL — AB (ref 150–400)
PLATELETS: 99 10*3/uL — AB (ref 150–400)
Platelets: 96 10*3/uL — ABNORMAL LOW (ref 150–400)
RBC: 3.39 MIL/uL — AB (ref 4.22–5.81)
RBC: 3.43 MIL/uL — ABNORMAL LOW (ref 4.22–5.81)
RBC: 3.56 MIL/uL — AB (ref 4.22–5.81)
RBC: 3.61 MIL/uL — AB (ref 4.22–5.81)
RDW: 14.9 % (ref 11.5–15.5)
RDW: 14.6 % (ref 11.5–15.5)
RDW: 14.9 % (ref 11.5–15.5)
RDW: 14.5 % (ref 11.5–15.5)
WBC: 5.8 10*3/uL (ref 4.0–10.5)
WBC: 6.3 10*3/uL (ref 4.0–10.5)
WBC: 7.2 10*3/uL (ref 4.0–10.5)
WBC: 7.6 10*3/uL (ref 4.0–10.5)

## 2015-06-27 LAB — TYPE AND SCREEN
ABO/RH(D): A POS
ANTIBODY SCREEN: NEGATIVE
UNIT DIVISION: 0
UNIT DIVISION: 0

## 2015-06-27 LAB — PREPARE FRESH FROZEN PLASMA: Unit division: 0

## 2015-06-27 LAB — BASIC METABOLIC PANEL
ANION GAP: 4 — AB (ref 5–15)
BUN: 7 mg/dL (ref 6–20)
CALCIUM: 8.1 mg/dL — AB (ref 8.9–10.3)
CO2: 26 mmol/L (ref 22–32)
Chloride: 107 mmol/L (ref 101–111)
Creatinine, Ser: 0.69 mg/dL (ref 0.61–1.24)
Glucose, Bld: 93 mg/dL (ref 65–99)
POTASSIUM: 4.2 mmol/L (ref 3.5–5.1)
Sodium: 137 mmol/L (ref 135–145)
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
## 2015-06-28 LAB — CBC
HCT: 30.2 % — ABNORMAL LOW (ref 39.0–52.0)
HCT: 30.5 % — ABNORMAL LOW (ref 39.0–52.0)
HCT: 31.9 % — ABNORMAL LOW (ref 39.0–52.0)
HEMOGLOBIN: 10.7 g/dL — AB (ref 13.0–17.0)
Hemoglobin: 10.3 g/dL — ABNORMAL LOW (ref 13.0–17.0)
Hemoglobin: 11 g/dL — ABNORMAL LOW (ref 13.0–17.0)
MCH: 30.8 pg (ref 26.0–34.0)
MCH: 31.7 pg (ref 26.0–34.0)
MCH: 32.4 pg (ref 26.0–34.0)
MCHC: 33.8 g/dL (ref 30.0–36.0)
MCHC: 34.5 g/dL (ref 30.0–36.0)
MCHC: 35.4 g/dL (ref 30.0–36.0)
MCV: 91.3 fL (ref 78.0–100.0)
MCV: 91.5 fL (ref 78.0–100.0)
MCV: 91.9 fL (ref 78.0–100.0)
PLATELETS: 82 10*3/uL — AB (ref 150–400)
PLATELETS: 87 10*3/uL — AB (ref 150–400)
PLATELETS: 88 10*3/uL — AB (ref 150–400)
RBC: 3.3 MIL/uL — ABNORMAL LOW (ref 4.22–5.81)
RBC: 3.34 MIL/uL — AB (ref 4.22–5.81)
RBC: 3.47 MIL/uL — AB (ref 4.22–5.81)
RDW: 14.1 % (ref 11.5–15.5)
RDW: 14.2 % (ref 11.5–15.5)
RDW: 14.4 % (ref 11.5–15.5)
WBC: 6.8 10*3/uL (ref 4.0–10.5)
WBC: 7.3 10*3/uL (ref 4.0–10.5)
WBC: 7.8 10*3/uL (ref 4.0–10.5)

## 2015-06-28 MED ORDER — FENTANYL CITRATE (PF) 100 MCG/2ML IJ SOLN: 25.0000 ug | INTRAMUSCULAR | Status: DC | PRN

## 2015-06-28 MED ORDER — OXYCODONE HCL 5 MG PO TABS
5.0000 mg | ORAL_TABLET | ORAL | Status: DC | PRN
Start: 2015-06-28 — End: 2015-06-30
  Administered 2015-06-28: 10 mg via ORAL
  Filled 2015-06-28: qty 2
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
## 2015-06-29 LAB — CBC
HCT: 29.5 % — ABNORMAL LOW (ref 39.0–52.0)
HCT: 32.8 % — ABNORMAL LOW (ref 39.0–52.0)
Hemoglobin: 10.3 g/dL — ABNORMAL LOW (ref 13.0–17.0)
Hemoglobin: 11 g/dL — ABNORMAL LOW (ref 13.0–17.0)
MCH: 31.9 pg (ref 26.0–34.0)
MCH: 30.7 pg (ref 26.0–34.0)
MCHC: 34.9 g/dL (ref 30.0–36.0)
MCHC: 33.5 g/dL (ref 30.0–36.0)
MCV: 91.3 fL (ref 78.0–100.0)
MCV: 91.6 fL (ref 78.0–100.0)
PLATELETS: 94 10*3/uL — AB (ref 150–400)
PLATELETS: 97 10*3/uL — AB (ref 150–400)
RBC: 3.23 MIL/uL — AB (ref 4.22–5.81)
RBC: 3.58 MIL/uL — ABNORMAL LOW (ref 4.22–5.81)
RDW: 13.8 % (ref 11.5–15.5)
RDW: 13.7 % (ref 11.5–15.5)
WBC: 6.9 10*3/uL (ref 4.0–10.5)
WBC: 7.4 10*3/uL (ref 4.0–10.5)

## 2015-06-29 MED ORDER — POLYETHYLENE GLYCOL 3350 17 G PO PACK
17.0000 g | PACK | Freq: Every day | ORAL | Status: DC
  Administered 2015-06-29 – 2015-06-30 (×2): 17 g via ORAL
  Filled 2015-06-29 (×2): qty 1

## 2015-06-29 MED ORDER — DOCUSATE SODIUM 100 MG PO CAPS
100.0000 mg | ORAL_CAPSULE | Freq: Two times a day (BID) | ORAL | Status: DC
  Administered 2015-06-29 – 2015-06-30 (×3): 100 mg via ORAL
  Filled 2015-06-29 (×3): qty 1
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
## 2015-06-29 NOTE — Progress Notes (Signed)
Temp 102.6. Gave 2 Tylenol and encouraged IS qhr. Temp down to 100.1 now.
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
## 2015-06-30 DIAGNOSIS — D62 Acute posthemorrhagic anemia: Secondary | ICD-10-CM | POA: Diagnosis not present

## 2015-06-30 LAB — CBC
HEMATOCRIT: 30.9 % — AB (ref 39.0–52.0)
HEMATOCRIT: 34.1 % — AB (ref 39.0–52.0)
HEMOGLOBIN: 10.7 g/dL — AB (ref 13.0–17.0)
HEMOGLOBIN: 11.9 g/dL — AB (ref 13.0–17.0)
MCH: 31.6 pg (ref 26.0–34.0)
MCH: 32 pg (ref 26.0–34.0)
MCHC: 34.6 g/dL (ref 30.0–36.0)
MCHC: 34.9 g/dL (ref 30.0–36.0)
MCV: 91.2 fL (ref 78.0–100.0)
MCV: 91.7 fL (ref 78.0–100.0)
PLATELETS: 106 10*3/uL — AB (ref 150–400)
Platelets: 127 10*3/uL — ABNORMAL LOW (ref 150–400)
RBC: 3.39 MIL/uL — AB (ref 4.22–5.81)
RBC: 3.72 MIL/uL — ABNORMAL LOW (ref 4.22–5.81)
RDW: 13.7 % (ref 11.5–15.5)
RDW: 13.6 % (ref 11.5–15.5)
WBC: 7.5 10*3/uL (ref 4.0–10.5)
WBC: 7.6 10*3/uL (ref 4.0–10.5)

## 2015-06-30 MED ORDER — MAGNESIUM CITRATE PO SOLN
1.0000 | Freq: Once | ORAL | Status: AC
  Administered 2015-06-30: 1 via ORAL
  Filled 2015-06-30: qty 296
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
## 2015-06-30 NOTE — Discharge Instructions (Signed)
No running, jumping, ball or contact sports, bikes, skateboards, motorcycles, etc for 3 months. ° °

## 2015-06-30 NOTE — Progress Notes (Signed)
Pt discharged to home.  Discharge instructions explained to pt.  Pt had question regarding whether or not he could return to work. Instructed pt to call [facility] tomorrow.  Pt has all belongings.  IV's removed.  Pt ambulated off unit of his own.

## 2015-06-30 NOTE — Progress Notes (Signed)
Notified Dr Donell BeersByerly of temp of 100.4.  MD states it is fine for pt to be discharged.  Order to be carried out.

## 2015-07-01 ENCOUNTER — Telehealth (HOSPITAL_COMMUNITY): Payer: Self-pay | Admitting: Emergency Medicine

## 2015-07-01 ENCOUNTER — Telehealth (HOSPITAL_COMMUNITY): Payer: Self-pay | Admitting: Orthopedic Surgery

## 2015-07-01 NOTE — Telephone Encounter (Signed)
Spoke with Nettie ElmSylvia and answered pt's questions.

## 2015-07-01 NOTE — Telephone Encounter (Signed)
Told [facility] to tell pt that he only needed to come back prn.

## 2015-07-02 ENCOUNTER — Ambulatory Visit (INDEPENDENT_AMBULATORY_CARE_PROVIDER_SITE_OTHER): Payer: BLUE CROSS/BLUE SHIELD | Admitting: Emergency Medicine

## 2015-07-02 ENCOUNTER — Ambulatory Visit (INDEPENDENT_AMBULATORY_CARE_PROVIDER_SITE_OTHER): Payer: BLUE CROSS/BLUE SHIELD

## 2015-07-02 VITALS — BP 110/60 | HR 87 | Temp 98.9°F | Resp 16 | Ht 70.5 in | Wt 182.6 lb

## 2015-07-02 DIAGNOSIS — S36039A Unspecified laceration of spleen, initial encounter: Secondary | ICD-10-CM

## 2015-07-02 DIAGNOSIS — M25512 Pain in left shoulder: Secondary | ICD-10-CM

## 2015-07-02 LAB — POCT CBC
GRANULOCYTE PERCENT: 73.9 % (ref 37–80)
HEMATOCRIT: 33.4 % — AB (ref 43.5–53.7)
HEMOGLOBIN: 11.8 g/dL — AB (ref 14.1–18.1)
Lymph, poc: 1.5 (ref 0.6–3.4)
MCH, POC: 31.4 pg — AB (ref 27–31.2)
MCHC: 35.4 g/dL (ref 31.8–35.4)
MCV: 88.9 fL (ref 80–97)
MID (cbc): 1.1 — AB (ref 0–0.9)
MPV: 6.1 fL (ref 0–99.8)
POC GRANULOCYTE: 7.4 — AB (ref 2–6.9)
POC LYMPH PERCENT: 15.2 %L (ref 10–50)
POC MID %: 10.9 % (ref 0–12)
Platelet Count, POC: 209 10*3/uL (ref 142–424)
RBC: 3.75 M/uL — AB (ref 4.69–6.13)
RDW, POC: 14 %
WBC: 10 10*3/uL (ref 4.6–10.2)

## 2015-07-02 MED ORDER — HYDROCODONE-ACETAMINOPHEN 5-325 MG PO TABS: 1.0000 | ORAL_TABLET | ORAL | Status: AC | PRN

## 2015-07-02 NOTE — Patient Instructions (Signed)
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
## 2015-07-12 ENCOUNTER — Ambulatory Visit (INDEPENDENT_AMBULATORY_CARE_PROVIDER_SITE_OTHER): Payer: BLUE CROSS/BLUE SHIELD | Admitting: Family Medicine

## 2015-07-12 VITALS — BP 108/70 | HR 96 | Temp 98.3°F | Resp 18 | Ht 72.0 in | Wt 175.4 lb

## 2015-07-12 DIAGNOSIS — L02413 Cutaneous abscess of right upper limb: Secondary | ICD-10-CM

## 2015-07-12 MED ORDER — DOXYCYCLINE HYCLATE 100 MG PO CAPS: 100.0000 mg | ORAL_CAPSULE | Freq: Two times a day (BID) | ORAL | Status: AC
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
## 2015-07-12 NOTE — Patient Instructions (Signed)
We drained an abscess on your right arm today Leave the dressing in place until tomorrow- then you can shower and come in for a dressing/ packing change Today try some hot compresses over the area to encourage drainage  Use the doxycycline twice a day for infection

## 2015-07-13 ENCOUNTER — Ambulatory Visit (INDEPENDENT_AMBULATORY_CARE_PROVIDER_SITE_OTHER): Payer: BLUE CROSS/BLUE SHIELD | Admitting: Physician Assistant

## 2015-07-13 VITALS — BP 108/68 | HR 72 | Temp 98.8°F | Resp 16 | Ht 72.0 in | Wt 174.0 lb

## 2015-07-13 DIAGNOSIS — L02413 Cutaneous abscess of right upper limb: Secondary | ICD-10-CM
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
## 2015-07-15 ENCOUNTER — Ambulatory Visit (INDEPENDENT_AMBULATORY_CARE_PROVIDER_SITE_OTHER): Payer: BLUE CROSS/BLUE SHIELD | Admitting: Physician Assistant

## 2015-07-15 VITALS — BP 108/68 | HR 104 | Temp 98.8°F | Resp 18

## 2015-07-15 DIAGNOSIS — L02413 Cutaneous abscess of right upper limb: Secondary | ICD-10-CM

## 2015-07-15 NOTE — Patient Instructions (Signed)
Change dressing on right arm at least 2x/daily and wash before applying a new dressing.  Stop wearing a dressing when the area is healed with new skin  Wash it aggressively with soap and water
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
## 2015-08-06 NOTE — Progress Notes (Signed)
  Medical screening examination/treatment/procedure(s) were performed by non-physician practitioner and as supervising physician I was immediately available for consultation/collaboration.     

## 2015-12-20 DIAGNOSIS — M7552 Bursitis of left shoulder: Secondary | ICD-10-CM | POA: Diagnosis not present

## 2015-12-20 DIAGNOSIS — M542 Cervicalgia: Secondary | ICD-10-CM | POA: Diagnosis not present

## 2016-11-01 ENCOUNTER — Ambulatory Visit (INDEPENDENT_AMBULATORY_CARE_PROVIDER_SITE_OTHER): Payer: Self-pay

## 2016-11-01 ENCOUNTER — Encounter (INDEPENDENT_AMBULATORY_CARE_PROVIDER_SITE_OTHER): Payer: Self-pay | Admitting: Orthopedic Surgery

## 2016-11-01 ENCOUNTER — Ambulatory Visit (INDEPENDENT_AMBULATORY_CARE_PROVIDER_SITE_OTHER): Payer: BLUE CROSS/BLUE SHIELD | Admitting: Orthopedic Surgery

## 2016-11-01 VITALS — BP 114/74 | HR 75 | Resp 14 | Ht 74.0 in | Wt 185.0 lb

## 2016-11-01 DIAGNOSIS — M25511 Pain in right shoulder: Secondary | ICD-10-CM | POA: Diagnosis not present

## 2016-11-01 DIAGNOSIS — G8929 Other chronic pain: Secondary | ICD-10-CM | POA: Diagnosis not present

## 2016-11-01 DIAGNOSIS — M7541 Impingement syndrome of right shoulder: Secondary | ICD-10-CM

## 2016-11-01 DIAGNOSIS — M7501 Adhesive capsulitis of right shoulder: Secondary | ICD-10-CM

## 2016-11-01 MED ORDER — BUPIVACAINE HCL 0.5 % IJ SOLN
2.0000 mL | INTRAMUSCULAR | Status: AC | PRN
  Administered 2016-11-01: 2 mL via INTRA_ARTICULAR

## 2016-11-01 MED ORDER — LIDOCAINE HCL 1 % IJ SOLN
2.0000 mL | INTRAMUSCULAR | Status: AC | PRN
  Administered 2016-11-01: 2 mL

## 2016-11-01 MED ORDER — METHYLPREDNISOLONE ACETATE 40 MG/ML IJ SUSP
80.0000 mg | INTRAMUSCULAR | Status: AC | PRN
  Administered 2016-11-01: 80 mg
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
## 2016-11-08 ENCOUNTER — Ambulatory Visit
Admission: RE | Admit: 2016-11-08 | Discharge: 2016-11-08 | Disposition: A | Discharge status: Home / Self Care | Payer: BLUE CROSS/BLUE SHIELD | Source: Ambulatory Visit | Attending: Orthopedic Surgery | Admitting: Orthopedic Surgery

## 2016-11-08 DIAGNOSIS — M25511 Pain in right shoulder: Secondary | ICD-10-CM | POA: Diagnosis not present

## 2016-11-16 ENCOUNTER — Ambulatory Visit (INDEPENDENT_AMBULATORY_CARE_PROVIDER_SITE_OTHER): Payer: BLUE CROSS/BLUE SHIELD | Admitting: Orthopaedic Surgery

## 2016-11-16 ENCOUNTER — Encounter (INDEPENDENT_AMBULATORY_CARE_PROVIDER_SITE_OTHER): Payer: Self-pay | Admitting: Orthopaedic Surgery

## 2016-11-16 VITALS — BP 89/53 | HR 66 | Resp 14 | Ht 74.0 in | Wt 185.0 lb

## 2016-11-16 DIAGNOSIS — M25511 Pain in right shoulder: Secondary | ICD-10-CM

## 2016-11-16 DIAGNOSIS — G8929 Other chronic pain: Secondary | ICD-10-CM | POA: Diagnosis not present
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
## 2016-11-20 ENCOUNTER — Ambulatory Visit (INDEPENDENT_AMBULATORY_CARE_PROVIDER_SITE_OTHER): Payer: BLUE CROSS/BLUE SHIELD | Admitting: Orthopaedic Surgery

## 2016-11-23 DIAGNOSIS — M7501 Adhesive capsulitis of right shoulder: Secondary | ICD-10-CM | POA: Diagnosis not present

## 2016-11-23 DIAGNOSIS — G8918 Other acute postprocedural pain: Secondary | ICD-10-CM | POA: Diagnosis not present

## 2016-11-27 ENCOUNTER — Inpatient Hospital Stay (INDEPENDENT_AMBULATORY_CARE_PROVIDER_SITE_OTHER): Payer: BLUE CROSS/BLUE SHIELD | Admitting: Orthopaedic Surgery

## 2016-11-28 ENCOUNTER — Encounter (INDEPENDENT_AMBULATORY_CARE_PROVIDER_SITE_OTHER): Payer: Self-pay | Admitting: Orthopaedic Surgery

## 2016-11-28 ENCOUNTER — Ambulatory Visit (INDEPENDENT_AMBULATORY_CARE_PROVIDER_SITE_OTHER): Payer: BLUE CROSS/BLUE SHIELD | Admitting: Orthopaedic Surgery

## 2016-11-28 VITALS — BP 123/77 | HR 70 | Ht 74.0 in | Wt 185.0 lb

## 2016-11-28 DIAGNOSIS — M25511 Pain in right shoulder: Secondary | ICD-10-CM

## 2016-11-28 DIAGNOSIS — G8929 Other chronic pain: Secondary | ICD-10-CM
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
## 2016-12-22 ENCOUNTER — Ambulatory Visit (INDEPENDENT_AMBULATORY_CARE_PROVIDER_SITE_OTHER): Payer: BLUE CROSS/BLUE SHIELD | Admitting: Orthopaedic Surgery

## 2016-12-29 ENCOUNTER — Encounter (INDEPENDENT_AMBULATORY_CARE_PROVIDER_SITE_OTHER): Payer: Self-pay

## 2016-12-29 ENCOUNTER — Ambulatory Visit (INDEPENDENT_AMBULATORY_CARE_PROVIDER_SITE_OTHER): Payer: BLUE CROSS/BLUE SHIELD | Admitting: Orthopaedic Surgery

## 2016-12-29 ENCOUNTER — Encounter (INDEPENDENT_AMBULATORY_CARE_PROVIDER_SITE_OTHER): Payer: Self-pay | Admitting: Orthopaedic Surgery

## 2016-12-29 VITALS — BP 111/61 | HR 70 | Ht 72.0 in | Wt 175.0 lb

## 2016-12-29 DIAGNOSIS — G8929 Other chronic pain: Secondary | ICD-10-CM

## 2016-12-29 DIAGNOSIS — M25511 Pain in right shoulder: Secondary | ICD-10-CM
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
## 2017-03-23 DIAGNOSIS — Z0000 Encounter for general adult medical examination without abnormal findings: Secondary | ICD-10-CM | POA: Diagnosis not present

## 2017-03-23 DIAGNOSIS — Z125 Encounter for screening for malignant neoplasm of prostate: Secondary | ICD-10-CM | POA: Diagnosis not present

## 2017-05-11 DIAGNOSIS — R531 Weakness: Secondary | ICD-10-CM | POA: Diagnosis not present

## 2017-05-11 DIAGNOSIS — Z0000 Encounter for general adult medical examination without abnormal findings: Secondary | ICD-10-CM | POA: Diagnosis not present

## 2017-05-11 DIAGNOSIS — E7800 Pure hypercholesterolemia, unspecified: Secondary | ICD-10-CM | POA: Diagnosis not present
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
## 2018-09-12 DIAGNOSIS — Z0000 Encounter for general adult medical examination without abnormal findings: Secondary | ICD-10-CM | POA: Diagnosis not present

## 2018-09-12 DIAGNOSIS — E7800 Pure hypercholesterolemia, unspecified: Secondary | ICD-10-CM | POA: Diagnosis not present

## 2018-09-12 DIAGNOSIS — Z125 Encounter for screening for malignant neoplasm of prostate: Secondary | ICD-10-CM | POA: Diagnosis not present

## 2018-09-17 DIAGNOSIS — Z0000 Encounter for general adult medical examination without abnormal findings: Secondary | ICD-10-CM | POA: Diagnosis not present
